# Patient Record
Sex: Male | Born: 1960 | Race: Black or African American | Hispanic: No | Marital: Married | Smoking: Never smoker
Health system: Southern US, Community
[De-identification: ages and names within clinical notes are randomized; demographics above are authoritative.]

## PROBLEM LIST (undated history)

## (undated) HISTORY — PX: ANTERIOR CRUCIATE LIGAMENT REPAIR: SHX115

---

## 1998-08-31 ENCOUNTER — Encounter: Admission: RE | Admit: 1998-08-31 | Discharge: 1998-08-31 | Payer: Self-pay | Admitting: *Deleted

## 2004-06-13 ENCOUNTER — Emergency Department (HOSPITAL_COMMUNITY): Admission: EM | Admit: 2004-06-13 | Discharge: 2004-06-13 | Payer: Self-pay | Admitting: Emergency Medicine

## 2018-12-19 ENCOUNTER — Ambulatory Visit (HOSPITAL_COMMUNITY)
Admission: EM | Admit: 2018-12-19 | Discharge: 2018-12-19 | Disposition: A | Discharge status: Home / Self Care | Payer: BC Managed Care – PPO | Attending: Family Medicine | Admitting: Family Medicine

## 2018-12-19 ENCOUNTER — Encounter (HOSPITAL_COMMUNITY): Payer: Self-pay | Admitting: Emergency Medicine

## 2018-12-19 DIAGNOSIS — J3089 Other allergic rhinitis: Secondary | ICD-10-CM | POA: Insufficient documentation

## 2018-12-19 DIAGNOSIS — J3489 Other specified disorders of nose and nasal sinuses: Secondary | ICD-10-CM | POA: Insufficient documentation

## 2018-12-19 MED ORDER — FLUTICASONE PROPIONATE 50 MCG/ACT NA SUSP: 2.0000 | Freq: Every day | NASAL | 0 refills | Status: AC

## 2018-12-19 MED ORDER — IPRATROPIUM BROMIDE 0.06 % NA SOLN: 2.0000 | Freq: Four times a day (QID) | NASAL | 0 refills | Status: AC

## 2018-12-19 NOTE — ED Triage Notes (Signed)
Pt c/o congestion, facial pain, allergies since Monday.

## 2018-12-19 NOTE — Discharge Instructions (Signed)
As discussed, this could be allergies or viral illness. You can start flonase, atrovent nasal spray for nasal congestion/drainage. Keep hydrated, your urine should be clear to pale yellow in color. Tylenol/motrin for fever and pain. Monitor for any worsening of symptoms, chest pain, shortness of breath, wheezing, swelling of the throat, follow up for reevaluation.

## 2018-12-19 NOTE — ED Provider Notes (Signed)
[facility]    CSN: [id number] Arrival date & time: 12/19/18  1230     History   Chief Complaint Chief Complaint  Patient presents with  . Facial Pain    HPI Arthur Holmsatrick Xue is a 58 y.o. male.   58 year old male comes in for few day history of URI symptoms. Has had cough, congestion, rhinorrhea, sneezing. Has had increase in facial pressure/sinus pressure. Denies fever, chills, night sweats. States woke up this morning with frontal headache, and did not go to work. Work required a work note and therefore came in for evaluation. otc cold medication with mild relief. Never smoker.      History reviewed. No pertinent past medical history.  There are no active problems to display for this patient.   Past Surgical History:  Procedure Laterality Date  . ANTERIOR CRUCIATE LIGAMENT REPAIR         Home Medications    Prior to Admission medications   Medication Sig Start Date End Date Taking? Authorizing Provider  fluticasone (FLONASE) 50 MCG/ACT nasal spray Place 2 sprays into both nostrils daily. 12/19/18   Cathie HoopsYu,  V, PA-C  ipratropium (ATROVENT) 0.06 % nasal spray Place 2 sprays into both nostrils 4 (four) times daily. 12/19/18   Belinda FisherYu,  V, PA-C    Family History Family History  Family history unknown: Yes    Social History Social History   Tobacco Use  . Smoking status: Never Smoker  Substance Use Topics  . Alcohol use: Never    Frequency: Never  . Drug use: Not on file     Allergies   Penicillins and Sulfamethoxazole-trimethoprim   Review of Systems Review of Systems  Reason unable to perform ROS: See HPI as above.     Physical Exam Triage Vital Signs ED Triage Vitals [12/19/18 1300]  Enc Vitals Group     BP (!) 160/119     Pulse Rate 93     Resp 18     Temp 97.8 F (36.6 C)     Temp src      SpO2 100 %     Weight      Height      Head Circumference      Peak Flow      Pain Score 5     Pain Loc      Pain Edu?      Excl. in  [place]?    No data found.  Updated Vital Signs BP (!) 160/119   Pulse 93   Temp 97.8 F (36.6 C)   Resp 18   SpO2 100%   Physical Exam Constitutional:      General: He is not in acute distress.    Appearance: He is well-developed. He is not ill-appearing, toxic-appearing or diaphoretic.  HENT:     Head: Normocephalic and atraumatic.     Right Ear: Tympanic membrane, ear canal and external ear normal. Tympanic membrane is not erythematous or bulging.     Left Ear: Tympanic membrane, ear canal and external ear normal. Tympanic membrane is not erythematous or bulging.     Nose:     Right Sinus: Frontal sinus tenderness present. No maxillary sinus tenderness.     Left Sinus: Frontal sinus tenderness present. No maxillary sinus tenderness.     Mouth/Throat:     Pharynx: Uvula midline.  Eyes:     Conjunctiva/sclera: Conjunctivae normal.     Pupils: Pupils are equal, round, and reactive to light.  Neck:  Musculoskeletal: Normal range of motion and neck supple.  Cardiovascular:     Rate and Rhythm: Normal rate and regular rhythm.     Heart sounds: Normal heart sounds. No murmur. No friction rub. No gallop.   Pulmonary:     Effort: Pulmonary effort is normal.     Breath sounds: Normal breath sounds. No decreased breath sounds, wheezing, rhonchi or rales.  Lymphadenopathy:     Cervical: No cervical adenopathy.  Skin:    General: Skin is warm and dry.  Neurological:     Mental Status: He is alert and oriented to person, place, and time.  Psychiatric:        Behavior: Behavior normal.        Judgment: Judgment normal.      UC Treatments / Results  Labs (all labs ordered are listed, but only abnormal results are displayed) Labs Reviewed - No data to display  EKG None  Radiology No results found.  Procedures Procedures (including critical care time)  Medications Ordered in UC Medications - No data to display  Initial Impression / Assessment and Plan / UC Course  I  have reviewed the triage vital signs and the nursing notes.  Pertinent labs & imaging results that were available during my care of the patient were reviewed by me and considered in my medical decision making (see chart for details).    Discussed with patient history and exam most consistent with viral URI vs allergic rhinitis. Symptomatic treatment as needed. Push fluids. Return precautions given.   Final Clinical Impressions(s) / UC Diagnoses   Final diagnoses:  Non-seasonal allergic rhinitis due to other allergic trigger  Sinus pressure    ED Prescriptions    Medication Sig Dispense Auth. Provider   fluticasone (FLONASE) 50 MCG/ACT nasal spray Place 2 sprays into both nostrils daily. 1 g ,  V, PA-C   ipratropium (ATROVENT) 0.06 % nasal spray Place 2 sprays into both nostrils 4 (four) times daily. 15 mL Threasa Alpha, [place] 12/19/18 1420

## 2019-09-25 ENCOUNTER — Encounter (HOSPITAL_COMMUNITY): Payer: Self-pay

## 2019-09-25 ENCOUNTER — Other Ambulatory Visit: Payer: Self-pay

## 2019-09-25 ENCOUNTER — Ambulatory Visit (HOSPITAL_COMMUNITY)
Admission: EM | Admit: 2019-09-25 | Discharge: 2019-09-25 | Disposition: A | Discharge status: Home / Self Care | Payer: BC Managed Care – PPO | Attending: Family Medicine | Admitting: Family Medicine

## 2019-09-25 ENCOUNTER — Ambulatory Visit (INDEPENDENT_AMBULATORY_CARE_PROVIDER_SITE_OTHER): Payer: BC Managed Care – PPO

## 2019-09-25 DIAGNOSIS — M546 Pain in thoracic spine: Secondary | ICD-10-CM

## 2019-09-25 DIAGNOSIS — S161XXA Strain of muscle, fascia and tendon at neck level, initial encounter: Secondary | ICD-10-CM | POA: Diagnosis not present

## 2019-09-25 MED ORDER — IBUPROFEN 800 MG PO TABS: 800.0000 mg | ORAL_TABLET | Freq: Three times a day (TID) | ORAL | 0 refills | Status: AC

## 2019-09-25 MED ORDER — CYCLOBENZAPRINE HCL 5 MG PO TABS: 5.0000 mg | ORAL_TABLET | Freq: Two times a day (BID) | ORAL | 0 refills | Status: AC | PRN
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
## 2019-09-25 NOTE — Discharge Instructions (Addendum)
No fracture in neck Use anti-inflammatories for pain/swelling. You may take up to 800 mg Ibuprofen every 8 hours with food. You may supplement Ibuprofen with Tylenol [phone] mg every 8 hours.   You may use flexeril as needed to help with pain. This is a muscle relaxer and causes sedation- please use only at bedtime or when you will be home and not have to drive/work  Alternate ice and heat Gentle stretching Avoid complete bed rest  Follow up if symptoms worsening, not resolving
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
## 2019-10-05 ENCOUNTER — Other Ambulatory Visit: Payer: Self-pay

## 2019-10-05 ENCOUNTER — Encounter (HOSPITAL_COMMUNITY): Payer: Self-pay

## 2019-10-05 ENCOUNTER — Ambulatory Visit (HOSPITAL_COMMUNITY)
Admission: EM | Admit: 2019-10-05 | Discharge: 2019-10-05 | Disposition: A | Discharge status: Home / Self Care | Payer: BC Managed Care – PPO | Attending: Family Medicine | Admitting: Family Medicine

## 2019-10-05 DIAGNOSIS — M546 Pain in thoracic spine: Secondary | ICD-10-CM | POA: Diagnosis not present

## 2019-10-05 DIAGNOSIS — M542 Cervicalgia: Secondary | ICD-10-CM | POA: Diagnosis not present

## 2019-10-05 NOTE — Discharge Instructions (Signed)
Please use heat  Pleas try the exercises  Please follow up  Please take ibuprofen as needed
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
## 2019-10-25 ENCOUNTER — Other Ambulatory Visit: Payer: Self-pay

## 2019-10-25 DIAGNOSIS — Z20822 Contact with and (suspected) exposure to covid-19: Secondary | ICD-10-CM

## 2019-10-26 LAB — NOVEL CORONAVIRUS, NAA: SARS-CoV-2, NAA: NOT DETECTED

## 2019-11-03 ENCOUNTER — Other Ambulatory Visit: Payer: Self-pay | Admitting: Cardiology

## 2019-11-03 DIAGNOSIS — Z20822 Contact with and (suspected) exposure to covid-19: Secondary | ICD-10-CM

## 2019-11-05 LAB — NOVEL CORONAVIRUS, NAA: SARS-CoV-2, NAA: NOT DETECTED
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
